# Patient Record
Sex: Female | Born: 1985 | Race: Black or African American | Hispanic: No | Marital: Single | Smoking: Never smoker
Health system: Southern US, Community
[De-identification: ages and names within clinical notes are randomized; demographics above are authoritative.]

## PROBLEM LIST (undated history)

## (undated) DIAGNOSIS — IMO0002 Reserved for concepts with insufficient information to code with codable children: Secondary | ICD-10-CM

## (undated) DIAGNOSIS — E785 Hyperlipidemia, unspecified: Secondary | ICD-10-CM

## (undated) DIAGNOSIS — R87619 Unspecified abnormal cytological findings in specimens from cervix uteri: Secondary | ICD-10-CM

## (undated) HISTORY — DX: Hyperlipidemia, unspecified: E78.5

## (undated) HISTORY — DX: Unspecified abnormal cytological findings in specimens from cervix uteri: R87.619

## (undated) HISTORY — PX: NO PAST SURGERIES: SHX2092

## (undated) HISTORY — DX: Reserved for concepts with insufficient information to code with codable children: IMO0002

---
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
## 2006-03-06 ENCOUNTER — Emergency Department (HOSPITAL_COMMUNITY): Admission: EM | Admit: 2006-03-06 | Discharge: 2006-03-06 | Payer: Self-pay | Admitting: Emergency Medicine

## 2007-01-12 ENCOUNTER — Emergency Department (HOSPITAL_COMMUNITY): Admission: EM | Admit: 2007-01-12 | Discharge: 2007-01-12 | Payer: Self-pay | Admitting: Family Medicine

## 2008-05-10 IMAGING — CR DG CHEST 2V
2 series · 2 of 2 positions shown · non-contrast
Comparison: none

CLINICAL DATA: 21-year-old female, fever, cough, congestion. 
 CHEST - 2 VIEW:

[view not recorded (1 of 2)]
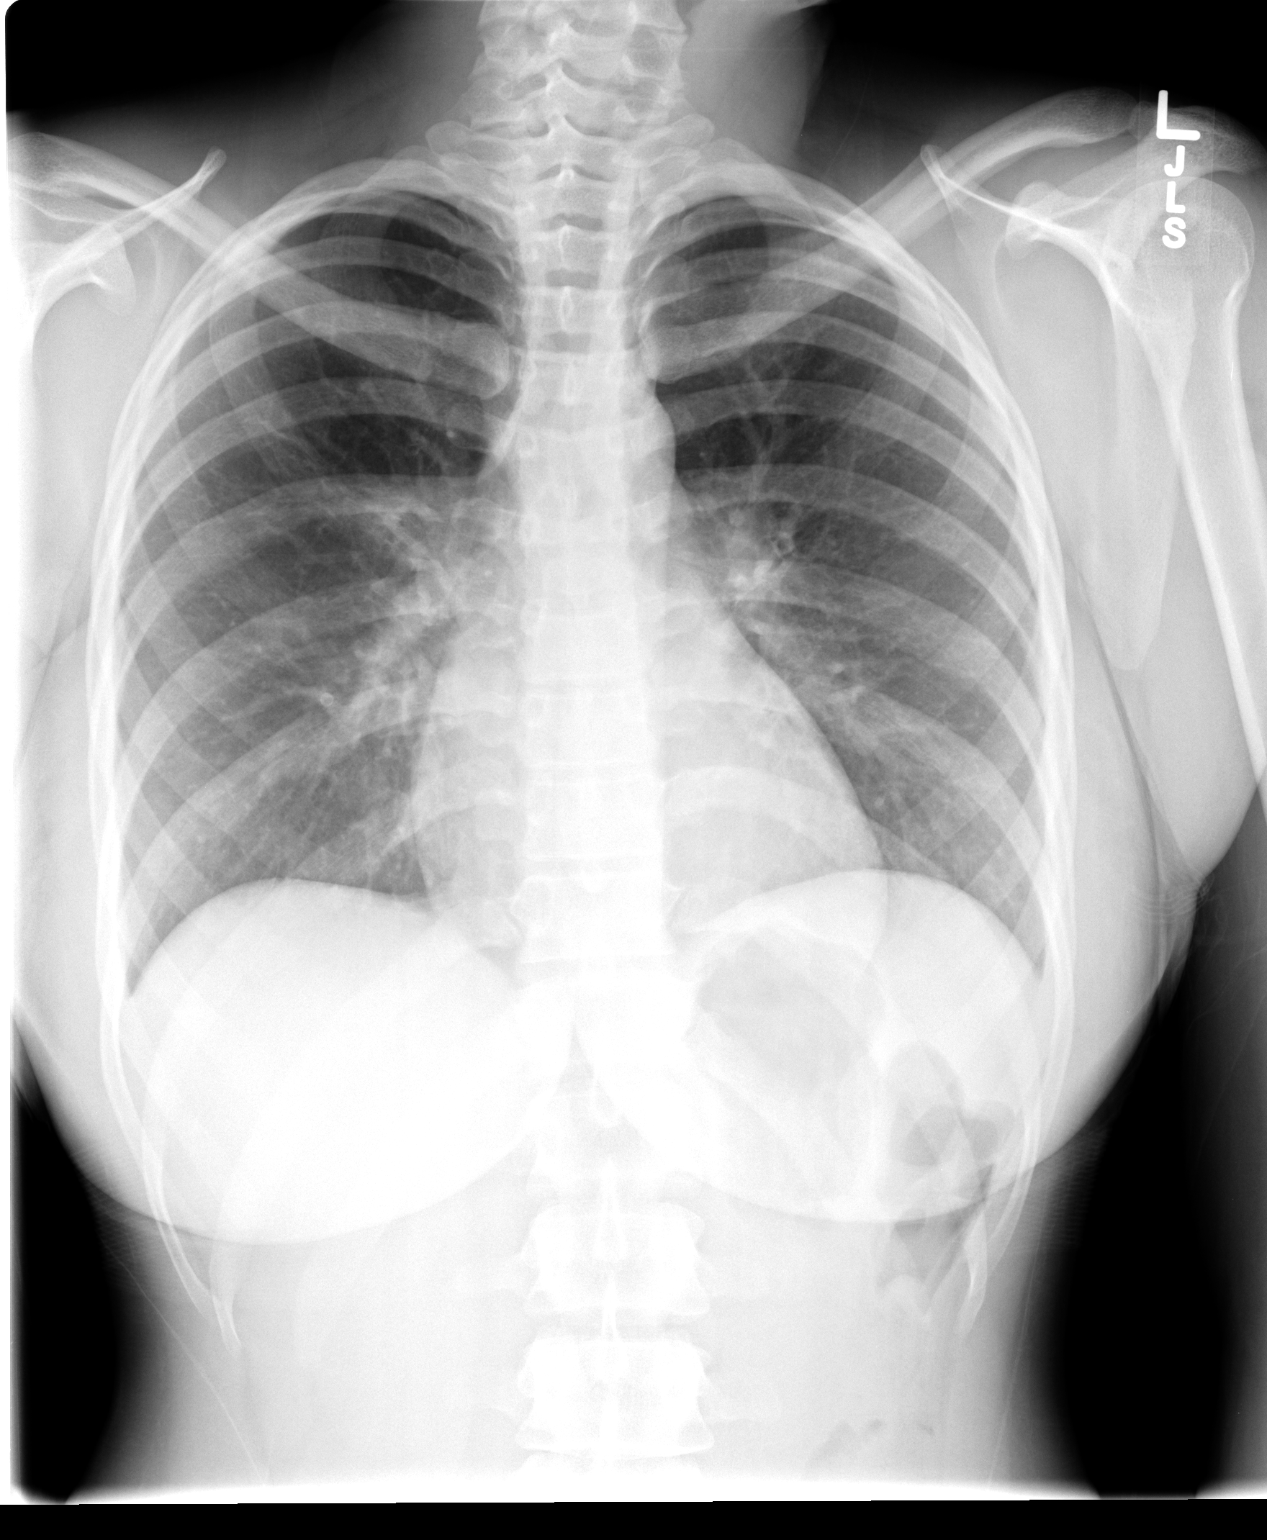

[view not recorded (2 of 2)]
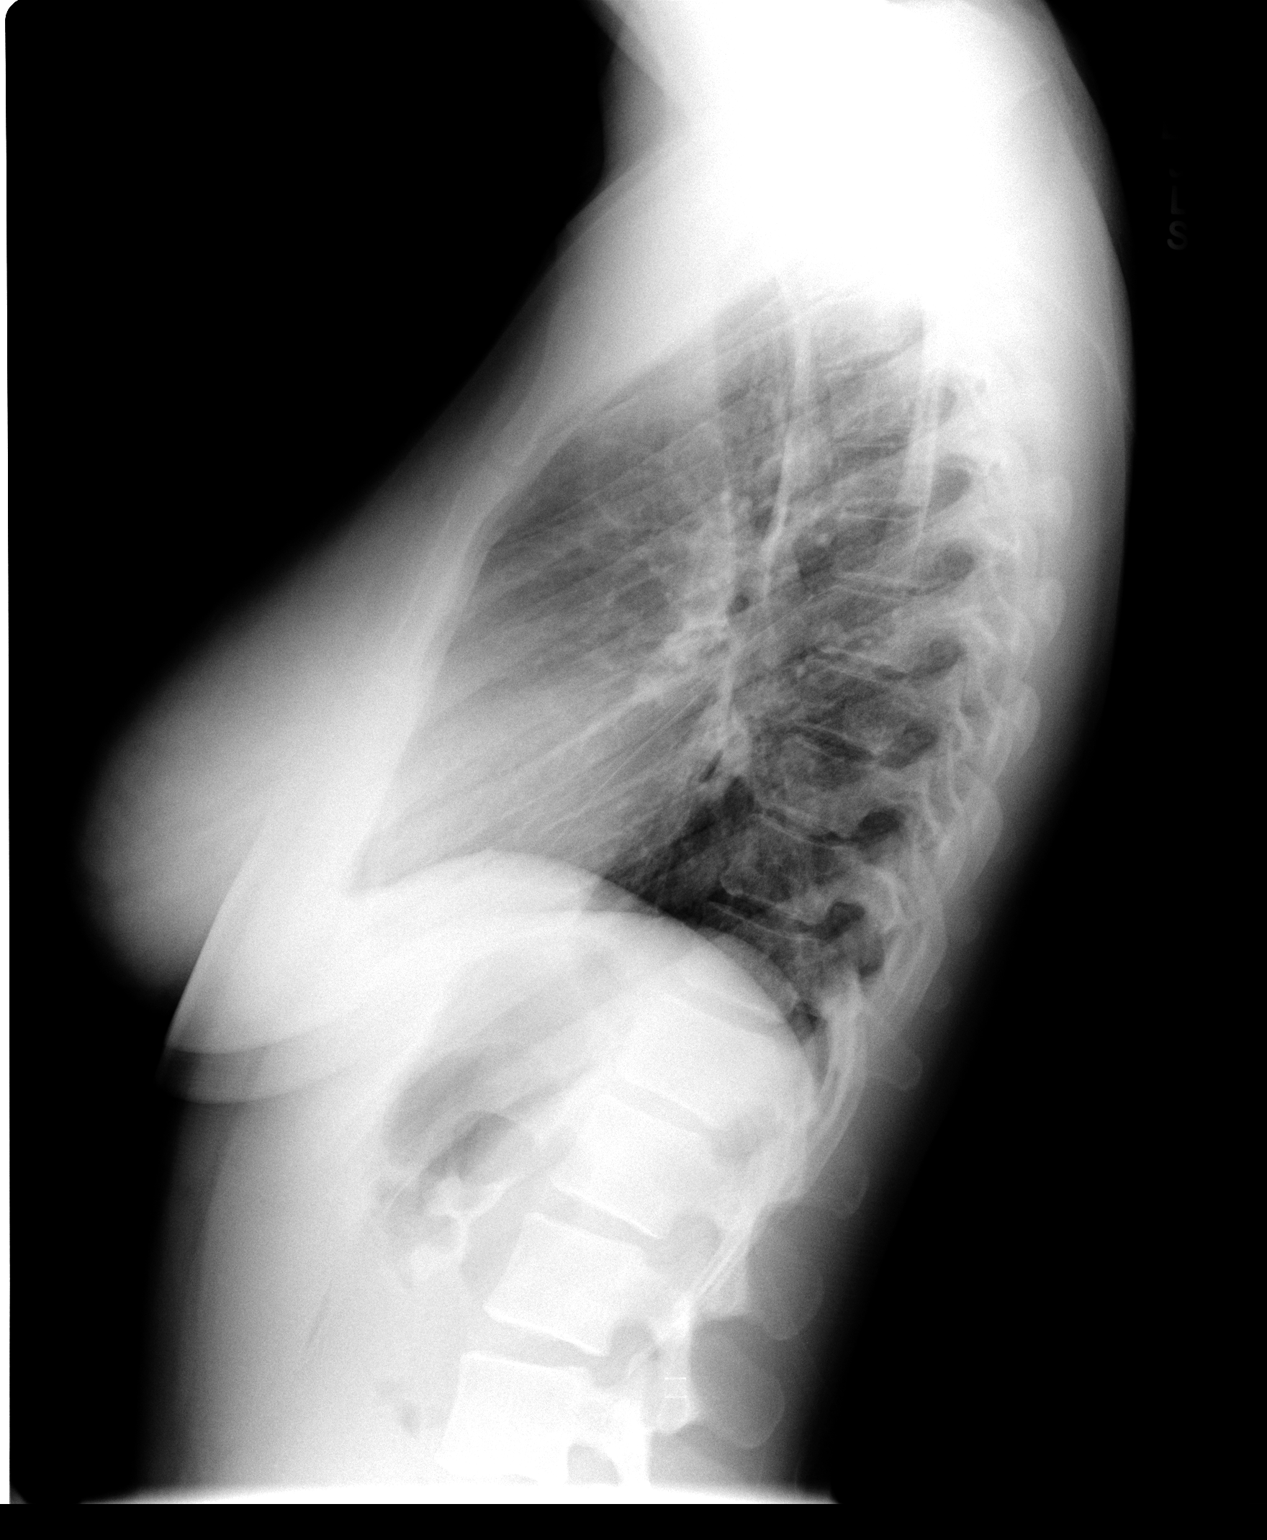

[2 of 2 positions shown; findings below may reference images not displayed]

FINDINGS: Central bronchial thickening is noted.  No acute pneumonia, consolidation, edema, effusion, or pneumothorax.  Normal heart size and vascularity.
IMPRESSION: 1.  Central bronchial thickening, bronchitis not excluded. 
 2.  No acute pneumonia.

## 2009-07-28 ENCOUNTER — Emergency Department (HOSPITAL_COMMUNITY): Admission: EM | Admit: 2009-07-28 | Discharge: 2009-07-28 | Payer: Self-pay | Admitting: Family Medicine

## 2009-12-11 ENCOUNTER — Emergency Department (HOSPITAL_COMMUNITY)
Admission: EM | Admit: 2009-12-11 | Discharge: 2009-12-11 | Payer: Self-pay | Source: Home / Self Care | Admitting: Family Medicine

## 2010-03-16 ENCOUNTER — Inpatient Hospital Stay (INDEPENDENT_AMBULATORY_CARE_PROVIDER_SITE_OTHER)
Admission: RE | Admit: 2010-03-16 | Discharge: 2010-03-16 | Disposition: A | Discharge status: Home / Self Care | Payer: 59 | Source: Ambulatory Visit | Attending: Family Medicine | Admitting: Family Medicine

## 2010-03-16 DIAGNOSIS — J029 Acute pharyngitis, unspecified: Secondary | ICD-10-CM

## 2010-03-16 DIAGNOSIS — J069 Acute upper respiratory infection, unspecified: Secondary | ICD-10-CM

## 2010-03-16 LAB — POCT RAPID STREP A (OFFICE): Streptococcus, Group A Screen (Direct): NEGATIVE

## 2010-03-18 LAB — POCT URINALYSIS DIPSTICK
Glucose, UA: NEGATIVE mg/dL
Ketones, ur: NEGATIVE mg/dL
Specific Gravity, Urine: >=1.03 (ref 1.005–1.030)
Urobilinogen, UA: 0.2 mg/dL (ref 0.0–1.0)

## 2010-03-18 LAB — POCT PREGNANCY, URINE: Preg Test, Ur: NEGATIVE

## 2010-09-25 LAB — INFLUENZA A AND B ANTIGEN (CONVERTED LAB)
Inflenza A Ag: NEGATIVE
Influenza B Ag: POSITIVE — AB

## 2010-09-25 LAB — POCT RAPID STREP A: Streptococcus, Group A Screen (Direct): NEGATIVE

## 2011-05-23 ENCOUNTER — Ambulatory Visit: Payer: Self-pay | Admitting: Obstetrics and Gynecology

## 2011-05-26 ENCOUNTER — Ambulatory Visit: Payer: Self-pay | Admitting: Obstetrics and Gynecology

## 2011-06-11 ENCOUNTER — Ambulatory Visit: Payer: Self-pay | Admitting: Obstetrics and Gynecology

## 2011-06-11 ENCOUNTER — Ambulatory Visit (INDEPENDENT_AMBULATORY_CARE_PROVIDER_SITE_OTHER): Payer: 59 | Admitting: Obstetrics and Gynecology

## 2011-06-11 ENCOUNTER — Encounter: Payer: Self-pay | Admitting: Obstetrics and Gynecology

## 2011-06-11 VITALS — BP 100/70 | HR 70 | Ht 67.0 in | Wt 158.0 lb

## 2011-06-11 DIAGNOSIS — IMO0002 Reserved for concepts with insufficient information to code with codable children: Secondary | ICD-10-CM | POA: Insufficient documentation

## 2011-06-11 DIAGNOSIS — N390 Urinary tract infection, site not specified: Secondary | ICD-10-CM

## 2011-06-11 DIAGNOSIS — E785 Hyperlipidemia, unspecified: Secondary | ICD-10-CM | POA: Insufficient documentation

## 2011-06-11 DIAGNOSIS — Z124 Encounter for screening for malignant neoplasm of cervix: Secondary | ICD-10-CM

## 2011-06-11 DIAGNOSIS — R8290 Unspecified abnormal findings in urine: Secondary | ICD-10-CM

## 2011-06-11 DIAGNOSIS — R82998 Other abnormal findings in urine: Secondary | ICD-10-CM

## 2011-06-11 DIAGNOSIS — Z01419 Encounter for gynecological examination (general) (routine) without abnormal findings: Secondary | ICD-10-CM

## 2011-06-11 LAB — POCT URINALYSIS DIPSTICK
Bilirubin, UA: NEGATIVE
Glucose, UA: NEGATIVE
Nitrite, UA: POSITIVE
Urobilinogen, UA: NEGATIVE

## 2011-06-11 MED ORDER — NORELGESTROMIN-ETH ESTRADIOL 150-35 MCG/24HR TD PTWK: 1.0000 | MEDICATED_PATCH | TRANSDERMAL | Status: AC

## 2011-06-11 MED ORDER — FLUCONAZOLE 150 MG PO TABS: 150.0000 mg | ORAL_TABLET | Freq: Once | ORAL | Status: AC

## 2011-06-11 MED ORDER — CIPROFLOXACIN HCL 500 MG PO TABS: 500.0000 mg | ORAL_TABLET | Freq: Two times a day (BID) | ORAL | Status: AC

## 2011-06-11 NOTE — Progress Notes (Signed)
Regular Periods: no Mammogram: no  Monthly Breast Ex.: yes Exercise: yes  Tetanus < 10 years: yes Seatbelts: yes  NI. Bladder Functn.: yes Abuse at home: no  Daily BM's: yes Stressful Work: yes  Healthy Diet: yes Sigmoid-Colonoscopy: no  Calcium: no Medical problems this year: no problems   LAST PAP:2012  nl  Contraception: O.E. PATCH  Mammogram:  NO  PCP: NO  PMH: NO CHANGE   [facility]: MOM DX WITH BLOOD CLOTS AND DIABETES  Last Bone Scan: NO

## 2011-06-11 NOTE — Progress Notes (Signed)
Subjective:    Laura Banks is a 26 y.o. female, G0P0, who presents for an annual exam. The patient reports malodorous  urine.  Menstrual cycle:   LMP: Patient's last menstrual period was 06/08/2011.             Review of Systems Pertinent items are noted in HPI. Denies pelvic pain, urinary tract symptoms, vaginitis symptoms, irregular bleeding, menopausal symptoms, change in bowel habits or rectal bleeding   Objective:    BP 100/70  Pulse 70  Ht 5\' 7"  (1.702 m)  Wt 158 lb (71.668 kg)  BMI 24.75 kg/m2  LMP 06/08/2011   @Weigh @t :  Wt Readings from Last 1 Encounters:  06/11/11 158 lb (71.668 kg)   Body mass index is 24.75 kg/(m^2). General Appearance: Alert, no acute distress HEENT: Grossly normal Neck / Thyroid: Supple, no thyromegaly or cervical adenopathy Lungs: Clear to auscultation bilaterally Back: No CVA tenderness Breast Exam: No masses or nodes.No dimpling, nipple retraction or discharge. Cardiovascular: Regular rate and rhythm.  Gastrointestinal: Soft, non-tender, no masses or organomegaly Pelvic Exam: EGBUS-wnl, vagina-normal rugae, small blood, cervix- without lesions or tenderness, uterus appears normal size shape and consistency, adnexae-no masses or tenderness Lymphatic Exam: Non-palpable nodes in neck, clavicular,  axillary, or inguinal regions  Skin: no rashes or abnormalities Extremities: no clubbing cyanosis or edema  Neurologic: grossly normal Psychiatric: Alert and oriented  Urinalysis: pH 5.0; SG- 1.030;  nitrite  +; blood  2+ UPT: negative   Assessment:   Routine GYN Exam UTI Plan:  Cipro 500 mg #6 bid x 3 days no refill  Diflucan 150 mg #1  1 po stat-yeast prophalaxis  Ortho Evra Patch  #9 apply weekly as directed 3 of 4 weeks 4 refills  PAP sent  RTO 1 year or prn  Laura Banks,ELMIRAPA-C

## 2011-06-13 LAB — URINE CULTURE: Colony Count: 80000

## 2011-06-13 LAB — PAP IG W/ RFLX HPV ASCU

## 2011-11-14 ENCOUNTER — Emergency Department (HOSPITAL_COMMUNITY)
Admission: EM | Admit: 2011-11-14 | Discharge: 2011-11-14 | Disposition: A | Discharge status: Home / Self Care | Payer: 59 | Source: Home / Self Care

## 2011-11-14 ENCOUNTER — Encounter (HOSPITAL_COMMUNITY): Payer: Self-pay | Admitting: Emergency Medicine

## 2011-11-14 DIAGNOSIS — J069 Acute upper respiratory infection, unspecified: Secondary | ICD-10-CM

## 2011-11-14 DIAGNOSIS — H68009 Unspecified Eustachian salpingitis, unspecified ear: Secondary | ICD-10-CM

## 2011-11-14 DIAGNOSIS — J029 Acute pharyngitis, unspecified: Secondary | ICD-10-CM

## 2011-11-14 MED ORDER — FLUTICASONE PROPIONATE 50 MCG/ACT NA SUSP: 2.0000 | Freq: Every day | NASAL | Status: DC

## 2011-11-14 NOTE — ED Provider Notes (Signed)
History     CSN: [id number]  Arrival date & time 11/14/11  0853   None     Chief Complaint  Patient presents with  . Sore Throat    (Consider location/radiation/quality/duration/timing/severity/associated sxs/prior treatment) HPI Comments: 26 year old female presents with a sore throat for 3 days. She also states her ears are burning her head feels full. She has PND and laryngitis. Recently having chills but not in the past day or 2, denies fever, abdominal pain, nausea, vomiting, diarrhea.   Past Medical History  Diagnosis Date  . Hyperlipidemia   . Abnormal pap     Past Surgical History  Procedure Date  . No past surgeries     Family History  Problem Relation Age of Onset  . Deep vein thrombosis Mother   . Diabetes Mother   . Hypertension Father   . Heart disease Maternal Grandmother     triple a  . Stroke Maternal Grandfather     History  Substance Use Topics  . Smoking status: Never Smoker   . Smokeless tobacco: Never Used  . Alcohol Use: No    OB History    Grav Para Term Preterm Abortions TAB SAB Ect Mult Living   0               Review of Systems  Constitutional: Negative for fever, chills, activity change, appetite change and fatigue.  HENT: Positive for congestion, sore throat, rhinorrhea, voice change and postnasal drip. Negative for facial swelling, neck pain and neck stiffness.   Eyes: Negative.   Respiratory: Negative.   Cardiovascular: Negative.   Gastrointestinal: Negative.   Genitourinary: Negative.   Skin: Negative for pallor and rash.  Neurological: Negative.   Psychiatric/Behavioral: Negative.     Allergies  Review of patient's allergies indicates no known allergies.  Home Medications   Current Outpatient Rx  Name  Route  Sig  Dispense  Refill  . NORELGESTROMIN-ETH ESTRADIOL 150-20 MCG/24HR TD PTWK   Transdermal   Place 1 patch onto the skin once a week.   9 patch   4   . BIOTIN 1000 MCG PO TABS   Oral   Take 1,000 mcg  by mouth 3 (three) times daily.           BP 107/75  Pulse 85  Temp 98.1 F (36.7 C) (Oral)  Resp 18  SpO2 99%  LMP 11/07/2011  Physical Exam  Constitutional: She is oriented to person, place, and time. She appears well-developed and well-nourished. No distress.  HENT:  Nose: Nose normal.  Mouth/Throat: No oropharyngeal exudate.       Oropharynx is erythematous with clear PND. No exudates or edema. Airway widely  patent  Eyes: Conjunctivae normal and EOM are normal.  Neck: Normal range of motion. Neck supple.  Cardiovascular: Normal rate and regular rhythm.   Pulmonary/Chest: Effort normal and breath sounds normal. No respiratory distress. She has no wheezes.  Musculoskeletal: Normal range of motion. She exhibits no edema.  Lymphadenopathy:    She has no cervical adenopathy.  Neurological: She is alert and oriented to person, place, and time.  Skin: Skin is warm and dry. No rash noted.  Psychiatric: She has a normal mood and affect.    ED Course  Procedures (including critical care time)   Labs Reviewed  POCT RAPID STREP A ([facility] ONLY)   No results found.   No diagnosis found.    MDM  * Results for orders placed during the hospital encounter  of 11/14/11  POCT RAPID STREP A ([facility] ONLY)      Component Value Range   Streptococcus, Group A Screen (Direct) NEGATIVE  NEGATIVE     Symptoms are consistent with URI and eustachian tube dysfunction. Prescribed fluticasone nasal spray and use as directed. NyQuil contains decongestant antihistamine to help with drainage and decongestion. That contains ingredients that you most likely need. Drink plenty of fluids and stay well hydrated Take vitamin C, chicken soup and  rest,     Hayden Rasmussen, NP 11/14/11 [id number]

## 2011-11-14 NOTE — ED Notes (Signed)
Pt c/o sore throat x3 days... Sx include: bilateral ear pain, cough w/yellow sputum and streaks of blood, nasal congestion... Denies: fevers, nausea, vomiting, diarrhea... Has tried OTC dayquil/nyquil w/o relief... Pt is alert w/no signs of distress.

## 2011-11-15 NOTE — ED Provider Notes (Signed)
Medical screening examination/treatment/procedure(s) were performed by resident physician or non-physician practitioner and as supervising physician I was immediately available for consultation/collaboration.   Mitzi Lilja DOUGLAS MD.    Conchita Truxillo D Shamir Sedlar, MD 11/15/11 0918 

## 2013-02-12 ENCOUNTER — Encounter: Payer: 59 | Attending: Obstetrics and Gynecology

## 2013-03-22 ENCOUNTER — Ambulatory Visit (INDEPENDENT_AMBULATORY_CARE_PROVIDER_SITE_OTHER): Payer: 59 | Admitting: Physician Assistant

## 2013-03-22 VITALS — BP 116/74 | HR 80 | Temp 98.0°F | Resp 16 | Ht 67.0 in | Wt 161.0 lb

## 2013-03-22 DIAGNOSIS — N390 Urinary tract infection, site not specified: Secondary | ICD-10-CM

## 2013-03-22 DIAGNOSIS — R350 Frequency of micturition: Secondary | ICD-10-CM

## 2013-03-22 DIAGNOSIS — R300 Dysuria: Secondary | ICD-10-CM

## 2013-03-22 LAB — POCT UA - MICROSCOPIC ONLY
Casts, Ur, LPF, POC: NEGATIVE
Crystals, Ur, HPF, POC: NEGATIVE
MUCUS UA: NEGATIVE
Yeast, UA: NEGATIVE

## 2013-03-22 LAB — POCT URINALYSIS DIPSTICK
BILIRUBIN UA: NEGATIVE
GLUCOSE UA: NEGATIVE
Ketones, UA: NEGATIVE
Nitrite, UA: POSITIVE
Protein, UA: 100
SPEC GRAV UA: 1.02
Urobilinogen, UA: 0.2
pH, UA: 7

## 2013-03-22 MED ORDER — CIPROFLOXACIN HCL 250 MG PO TABS
250.0000 mg | ORAL_TABLET | Freq: Two times a day (BID) | ORAL | Status: AC
Start: 2013-03-22 — End: ?

## 2013-03-22 MED ORDER — FLUCONAZOLE 150 MG PO TABS: 150.0000 mg | ORAL_TABLET | Freq: Once | ORAL | Status: AC

## 2013-03-22 MED ORDER — PHENAZOPYRIDINE HCL 200 MG PO TABS
200.0000 mg | ORAL_TABLET | Freq: Three times a day (TID) | ORAL | Status: AC | PRN
Start: 2013-03-22 — End: ?

## 2013-03-22 NOTE — Progress Notes (Signed)
   Subjective:    Patient ID: Laura DupontLakiesha Banks, female    DOB: 04/16/1985, 28 y.o.   MRN: [id number]  HPI 28 year old female presents for evaluation of acute onset of dysuria, urinary frequency, and suprapubic pressure.  She has had urgency and dribbling since class today.  Denies nausea, vomiting, fever, chills, flank pain, hematuria, or vaginal discharge.  Hx of a UTI 2 years ago. No hx of frequent UTI's. She is sexually active with 1 female partner. No concern about STI's. LNMP 03/13/13.  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      Review of Systems  Constitutional: Negative for fever and chills.  Gastrointestinal: Negative for nausea, vomiting and abdominal pain.  Genitourinary: Positive for dysuria and frequency. Negative for flank pain, vaginal bleeding, vaginal discharge, menstrual problem and pelvic pain.       Objective:   Physical Exam  Constitutional: She is oriented to person, place, and time. She appears well-developed and well-nourished.  HENT:  Head: Normocephalic and atraumatic.  Right Ear: External ear normal.  Left Ear: External ear normal.  Eyes: Conjunctivae are normal.  Neck: Normal range of motion.  Cardiovascular: Normal rate, regular rhythm and normal heart sounds.   Pulmonary/Chest: Effort normal and breath sounds normal.  Abdominal: Soft. Bowel sounds are normal. There is no tenderness. There is no rebound, no guarding and no CVA tenderness.  Neurological: She is alert and oriented to person, place, and time.  Psychiatric: She has a normal mood and affect. Her behavior is normal. Judgment and thought content normal.      Results for orders placed in visit on 03/22/13  POCT UA - MICROSCOPIC ONLY      Result Value Ref Range   WBC, Ur, HPF, POC tntc     RBC, urine, microscopic tntc     Bacteria, U Microscopic 2+     Mucus, UA neg     Epithelial cells, urine per micros 7-9     Crystals, Ur, HPF, POC neg      Casts, Ur, LPF, POC neg     Yeast, UA neg    POCT URINALYSIS DIPSTICK      Result Value Ref Range   Color, UA yellow     Clarity, UA cloudy     Glucose, UA neg     Bilirubin, UA neg     Ketones, UA neg     Spec Grav, UA 1.020     Blood, UA large     pH, UA 7.0     Protein, UA 100     Urobilinogen, UA 0.2     Nitrite, UA pos     Leukocytes, UA large (3+)         Assessment & Plan:  UTI (urinary tract infection) - Plan: ciprofloxacin (CIPRO) 250 MG tablet, Urine culture, phenazopyridine (PYRIDIUM) 200 MG tablet  Dysuria - Plan: POCT UA - Microscopic Only, POCT urinalysis dipstick, phenazopyridine (PYRIDIUM) 200 MG tablet  Urine culture sent Start Cipro 250 mg bid x 5 days Pyridium 200 mg tid prn pain Rx for diflucan to take if needed Push fluids Follow up if symptoms worsen or fail to improve.

## 2013-03-25 LAB — URINE CULTURE: Colony Count: >=100000
